# Patient Record
Sex: Male | Born: 1980 | Race: Black or African American | Hispanic: No | Marital: Single | Smoking: Never smoker
Health system: Southern US, Community
[De-identification: ages and names within clinical notes are randomized; demographics above are authoritative.]

---

## 2018-12-05 ENCOUNTER — Ambulatory Visit (INDEPENDENT_AMBULATORY_CARE_PROVIDER_SITE_OTHER): Payer: Managed Care, Other (non HMO)

## 2018-12-05 ENCOUNTER — Other Ambulatory Visit: Payer: Self-pay

## 2018-12-05 ENCOUNTER — Encounter: Payer: Self-pay | Admitting: Emergency Medicine

## 2018-12-05 ENCOUNTER — Ambulatory Visit
Admission: EM | Admit: 2018-12-05 | Discharge: 2018-12-05 | Disposition: A | Discharge status: Home / Self Care | Payer: Managed Care, Other (non HMO) | Attending: Family Medicine | Admitting: Family Medicine

## 2018-12-05 DIAGNOSIS — U071 COVID-19: Secondary | ICD-10-CM

## 2018-12-05 DIAGNOSIS — R0789 Other chest pain: Secondary | ICD-10-CM | POA: Diagnosis present

## 2018-12-05 DIAGNOSIS — R0602 Shortness of breath: Secondary | ICD-10-CM | POA: Diagnosis present

## 2018-12-05 DIAGNOSIS — R05 Cough: Secondary | ICD-10-CM

## 2018-12-05 NOTE — ED Triage Notes (Signed)
Patient states that he tested positive COVID on Tuesday.  Patient states that he saw his PCP earlier this week for just a stuffy nose.  Patient denies fevers prior today.  Patient states that yesterday he started having some SOB and chest tightness.  Patient denies a cough.

## 2018-12-05 NOTE — Discharge Instructions (Signed)
Go to Emergency Department if symptoms worsen °

## 2018-12-07 NOTE — ED Provider Notes (Signed)
[facility]    CSN: [id number] Arrival date & time: 12/05/18  1006      History   Chief Complaint Chief Complaint  Patient presents with  . Shortness of Breath  . Chest Pain    HPI Benjamin Ortega is a 38 y.o. male.   38 yo male with a c/o chest pains and shortness of breath. Patient tested covid-19 positive 3 days ago. Patient denies any cough, fevers, chills, palpitations, wheezing. Otherwise healthy.    Shortness of Breath Associated symptoms: chest pain   Chest Pain Associated symptoms: shortness of breath     History reviewed. No pertinent past medical history.  There are no active problems to display for this patient.   History reviewed. No pertinent surgical history.     Home Medications    Prior to Admission medications   Not on File    Family History Family History  Problem Relation Age of Onset  . Hypertension Mother   . Cancer Mother   . Healthy Father     Social History Social History   Tobacco Use  . Smoking status: Never Smoker  . Smokeless tobacco: Never Used  Substance Use Topics  . Alcohol use: Not Currently  . Drug use: Never     Allergies   Bee venom, Wasp venom, Banana, Oxycodone-acetaminophen, and Prednisone   Review of Systems Review of Systems  Respiratory: Positive for shortness of breath.   Cardiovascular: Positive for chest pain.     Physical Exam Triage Vital Signs ED Triage Vitals  Enc Vitals Group     BP 12/05/18 1022 (!) 144/106     Pulse Rate 12/05/18 1022 85     Resp 12/05/18 1022 16     Temp 12/05/18 1022 98.3 F (36.8 C)     Temp Source 12/05/18 1022 Oral     SpO2 12/05/18 1022 99 %     Weight 12/05/18 1018 280 lb (127 kg)     Height 12/05/18 1018 5\' 4"  (1.626 m)     Head Circumference --      Peak Flow --      Pain Score 12/05/18 1017 7     Pain Loc --      Pain Edu? --      Excl. in [place]? --    No data found.  Updated Vital Signs BP (!) 144/106 (BP Location: Left Arm)   Pulse  85   Temp 98.3 F (36.8 C) (Oral)   Resp 16   Ht 5\' 4"  (1.626 m)   Wt 127 kg   SpO2 99%   BMI 48.06 kg/m   Visual Acuity Right Eye Distance:   Left Eye Distance:   Bilateral Distance:    Right Eye Near:   Left Eye Near:    Bilateral Near:     Physical Exam Vitals signs and nursing note reviewed.  Constitutional:      General: He is not in acute distress.    Appearance: He is not toxic-appearing or diaphoretic.  Cardiovascular:     Rate and Rhythm: Normal rate and regular rhythm.     Pulses: Normal pulses.     Heart sounds: Normal heart sounds.  Pulmonary:     Effort: Pulmonary effort is normal. No respiratory distress.     Breath sounds: Normal breath sounds. No stridor. No wheezing, rhonchi or rales.  Neurological:     Mental Status: He is alert.      UC Treatments / Results  Labs (all  labs ordered are listed, but only abnormal results are displayed) Labs Reviewed - No data to display  EKG   Radiology No results found.  Procedures ED EKG  Date/Time: 12/07/2018 5:15 PM Performed by: Norval Gable, MD Authorized by: Norval Gable, MD   ECG reviewed by ED Physician in the absence of a cardiologist: yes   Interpretation:    Interpretation: normal   Rate:    ECG rate:  83   ECG rate assessment: normal   Rhythm:    Rhythm: sinus rhythm   Ectopy:    Ectopy: none   QRS:    QRS axis:  Normal   QRS intervals:  Normal Conduction:    Conduction: normal   ST segments:    ST segments:  Normal T waves:    T waves: normal     (including critical care time)  Medications Ordered in UC Medications - No data to display  Initial Impression / Assessment and Plan / UC Course  I have reviewed the triage vital signs and the nursing notes.  Pertinent labs & imaging results that were available during my care of the patient were reviewed by me and considered in my medical decision making (see chart for details).      Final Clinical Impressions(s) / UC  Diagnoses   Final diagnoses:  [id number] virus infection  Shortness of breath  Atypical chest pain     Discharge Instructions     Go to Emergency Department if symptoms worsen    ED Prescriptions    None     1. ekg/x-ray results and diagnosis reviewed with patient 2.. Recommend continue supportive treatment 3. Go to ED if symptoms worsen 4. Follow-up prn if symptoms worsen or don't improve  PDMP not reviewed this encounter.   Norval Gable, MD 12/07/18 [phone]

## 2019-06-23 ENCOUNTER — Emergency Department
Admission: EM | Admit: 2019-06-23 | Discharge: 2019-06-23 | Disposition: A | Discharge status: Home / Self Care | Payer: Managed Care, Other (non HMO) | Attending: Emergency Medicine | Admitting: Emergency Medicine

## 2019-06-23 ENCOUNTER — Encounter: Payer: Self-pay | Admitting: *Deleted

## 2019-06-23 ENCOUNTER — Other Ambulatory Visit: Payer: Self-pay

## 2019-06-23 DIAGNOSIS — S99921A Unspecified injury of right foot, initial encounter: Secondary | ICD-10-CM | POA: Diagnosis present

## 2019-06-23 DIAGNOSIS — Y929 Unspecified place or not applicable: Secondary | ICD-10-CM | POA: Diagnosis not present

## 2019-06-23 DIAGNOSIS — Y999 Unspecified external cause status: Secondary | ICD-10-CM | POA: Insufficient documentation

## 2019-06-23 DIAGNOSIS — L089 Local infection of the skin and subcutaneous tissue, unspecified: Secondary | ICD-10-CM | POA: Insufficient documentation

## 2019-06-23 DIAGNOSIS — Y939 Activity, unspecified: Secondary | ICD-10-CM | POA: Diagnosis not present

## 2019-06-23 DIAGNOSIS — R739 Hyperglycemia, unspecified: Secondary | ICD-10-CM | POA: Diagnosis not present

## 2019-06-23 DIAGNOSIS — X58XXXA Exposure to other specified factors, initial encounter: Secondary | ICD-10-CM | POA: Insufficient documentation

## 2019-06-23 DIAGNOSIS — L74513 Primary focal hyperhidrosis, soles: Secondary | ICD-10-CM | POA: Insufficient documentation

## 2019-06-23 DIAGNOSIS — S90424A Blister (nonthermal), right lesser toe(s), initial encounter: Secondary | ICD-10-CM | POA: Diagnosis not present

## 2019-06-23 LAB — CBC WITH DIFFERENTIAL/PLATELET
Abs Immature Granulocytes: 0.02 10*3/uL (ref 0.00–0.07)
Basophils Absolute: 0 10*3/uL (ref 0.0–0.1)
Basophils Relative: 0 %
Eosinophils Absolute: 0.2 10*3/uL (ref 0.0–0.5)
Eosinophils Relative: 3 %
HCT: 42.8 % (ref 39.0–52.0)
Hemoglobin: 13.9 g/dL (ref 13.0–17.0)
Immature Granulocytes: 0 %
Lymphocytes Relative: 32 %
Lymphs Abs: 2.4 10*3/uL (ref 0.7–4.0)
MCH: 28.5 pg (ref 26.0–34.0)
MCHC: 32.5 g/dL (ref 30.0–36.0)
MCV: 87.9 fL (ref 80.0–100.0)
Monocytes Absolute: 0.6 10*3/uL (ref 0.1–1.0)
Monocytes Relative: 9 %
Neutro Abs: 4.1 10*3/uL (ref 1.7–7.7)
Neutrophils Relative %: 56 %
Platelets: 213 10*3/uL (ref 150–400)
RBC: 4.87 MIL/uL (ref 4.22–5.81)
RDW: 11.8 % (ref 11.5–15.5)
WBC: 7.4 10*3/uL (ref 4.0–10.5)
nRBC: 0 % (ref 0.0–0.2)

## 2019-06-23 LAB — BASIC METABOLIC PANEL
Anion gap: 9 (ref 5–15)
BUN: 13 mg/dL (ref 6–20)
CO2: 24 mmol/L (ref 22–32)
Calcium: 9.2 mg/dL (ref 8.9–10.3)
Chloride: 104 mmol/L (ref 98–111)
Creatinine, Ser: 0.82 mg/dL (ref 0.61–1.24)
GFR calc Af Amer: >60 mL/min (ref >60)
GFR calc non Af Amer: >60 mL/min (ref >60)
Glucose, Bld: 211 mg/dL — ABNORMAL HIGH (ref 70–99)
Potassium: 3.6 mmol/L (ref 3.5–5.1)
Sodium: 137 mmol/L (ref 135–145)

## 2019-06-23 MED ORDER — CEPHALEXIN 500 MG PO CAPS: 500.0000 mg | ORAL_CAPSULE | Freq: Three times a day (TID) | ORAL | 0 refills | Status: AC

## 2019-06-23 MED ORDER — CEPHALEXIN 500 MG PO CAPS
500.0000 mg | ORAL_CAPSULE | Freq: Once | ORAL | Status: AC
  Administered 2019-06-23: 23:00:00 500 mg via ORAL
  Filled 2019-06-23: qty 1

## 2019-06-23 NOTE — ED Triage Notes (Signed)
Pt to ED for blister to right foot on Thursday that has not healed. Pt reports the pain has worsened and he started to feel chills and was concerned for an infection. Pt is not diabetic.

## 2019-06-23 NOTE — Discharge Instructions (Addendum)
Keep the blisters intact. Use gauze or Band-aids between the toes to allow protection. Take the prescription antibiotic as directed.

## 2019-06-23 NOTE — ED Notes (Signed)
See triage note- Pt reports blister between first and second toe on right foot. Pt popped blister and said that yellow pus drained out. Pt reports he gets a lot of blisters on his feet.

## 2019-06-23 NOTE — ED Provider Notes (Signed)
[facility] Emergency Department Provider Note ____________________________________________  Time seen: 2158  I have reviewed the triage vital signs and the nursing notes.  HISTORY  Chief Complaint  Blister and Toe Pain  HPI Benjamin Ortega is a 39 y.o. male presents himself to the ED for evaluation of a blister to the second toe on the right.  Patient presents after he admittedly attempted to drain the blister that developed tween his first and second toes.   Patient of the blister on Thursday and began to experience pain in 1 to be began to feel like chills he was concerned for a worsening infection.  Patient is not diabetic, denies any wet work but does note excessive sweating to his feet.  He denies any interim fevers he denies any purulent drainage from the toe.  He also denies any swelling, streaking, or increased pain to the right foot.  History reviewed. No pertinent past medical history.  There are no problems to display for this patient.   History reviewed. No pertinent surgical history.  Prior to Admission medications   Medication Sig Start Date End Date Taking? Authorizing Provider  cephALEXin (KEFLEX) 500 MG capsule Take 1 capsule (500 mg total) by mouth 3 (three) times daily for 7 days. 06/23/19 06/30/19  Vito Beg, Dannielle Karvonen, PA-C    Allergies Bee venom, Wasp venom, Banana, Oxycodone-acetaminophen, and Prednisone  Family History  Problem Relation Age of Onset  . Hypertension Mother   . Cancer Mother   . Healthy Father     Social History Social History   Tobacco Use  . Smoking status: Never Smoker  . Smokeless tobacco: Never Used  Substance Use Topics  . Alcohol use: Not Currently  . Drug use: Never    Review of Systems  Constitutional: Negative for fever. Cardiovascular: Negative for chest pain. Respiratory: Negative for shortness of breath. Musculoskeletal: Negative for back pain. Skin: Negative for rash.  Right foot blister as  above. Neurological: Negative for headaches, focal weakness or numbness. ____________________________________________  PHYSICAL EXAM:  VITAL SIGNS: ED Triage Vitals  Enc Vitals Group     BP 06/23/19 2141 (!) 163/99     Pulse Rate 06/23/19 2141 71     Resp 06/23/19 2141 16     Temp 06/23/19 2141 98.7 F (37.1 C)     Temp Source 06/23/19 2141 Oral     SpO2 06/23/19 2141 99 %     Weight 06/23/19 2142 292 lb (132.5 kg)     Height 06/23/19 2142 6\' 4"  (1.93 m)     Head Circumference --      Peak Flow --      Pain Score 06/23/19 2142 0     Pain Loc --      Pain Edu? --      Excl. in [place]? --     Constitutional: Alert and oriented. Well appearing and in no distress. Head: Normocephalic and atraumatic. Eyes: Conjunctivae are normal. PERRL. Normal extraocular movements Cardiovascular: Normal rate, regular rhythm. Normal distal pulses. Respiratory: Normal respiratory effort. No wheezes/rales/rhonchi. Gastrointestinal: Soft and nontender. No distention. Musculoskeletal: Nontender with normal range of motion in all extremities.  Neurologic:  Normal gait without ataxia. Normal speech and language. No gross focal neurologic deficits are appreciated. Skin:  Skin is warm, dry and intact. No rash noted.  Patient's right foot without any obvious swelling, ecchymosis, bruise, lymphangitis, or streaking.  Patient with a flat intact blister to the intertriginous portion of the second toe laterally.  There is some mild purulence noted.  There is also a single needlestick consistent with patient's previous attempt to drain the blister.  No dehiscence, purulence, fluctuance is noted. Psychiatric: Mood and affect are normal. Patient exhibits appropriate insight and judgment. ____________________________________________   LABS (pertinent positives/negatives) Labs Reviewed  BASIC METABOLIC PANEL - Abnormal; Notable for the following components:      Result Value   Glucose, Bld 211 (*)    All other  components within normal limits  CBC WITH DIFFERENTIAL/PLATELET  ___________________________________________  PROCEDURES  Keflex 500 mg p.o.  Procedures ____________________________________________  INITIAL IMPRESSION / ASSESSMENT AND PLAN / ED COURSE  She with ED evaluation and concern over possible infected blister to the second toe on the right.  Patient developed a blister several days earlier, presents today after he attempted a needle decompression.  He will be treated empirically with Keflex and his labs are reassuring as it shows no signs of an acute infectious process.  Patient was notified of his elevated blood sugar and will continue to monitor.  Return precautions have been reviewed.  He is to follow-up with podiatry for ongoing symptoms.  Benjamin Ortega was evaluated in Emergency Department on 06/23/2019 for the symptoms described in the history of present illness. He was evaluated in the context of the global COVID-19 pandemic, which necessitated consideration that the patient might be at risk for infection with the SARS-CoV-2 virus that causes COVID-19. Institutional protocols and algorithms that pertain to the evaluation of patients at risk for COVID-19 are in a state of rapid change based on information released by regulatory bodies including the CDC and federal and state organizations. These policies and algorithms were followed during the patient's care in the ED. ____________________________________________  FINAL CLINICAL IMPRESSION(S) / ED DIAGNOSES  Final diagnoses:  Blister of second toe, right, infected, initial encounter      Lissa Hoard, PA-C 06/23/19 2305    Dionne Bucy, MD 06/23/19 2327

## 2019-07-29 ENCOUNTER — Encounter: Payer: Self-pay | Admitting: Emergency Medicine

## 2019-07-29 ENCOUNTER — Emergency Department
Admission: EM | Admit: 2019-07-29 | Discharge: 2019-07-29 | Disposition: A | Discharge status: Home / Self Care | Payer: Managed Care, Other (non HMO) | Attending: Student | Admitting: Student

## 2019-07-29 DIAGNOSIS — R232 Flushing: Secondary | ICD-10-CM | POA: Diagnosis present

## 2019-07-29 DIAGNOSIS — T781XXA Other adverse food reactions, not elsewhere classified, initial encounter: Secondary | ICD-10-CM | POA: Diagnosis not present

## 2019-07-29 DIAGNOSIS — Z91030 Bee allergy status: Secondary | ICD-10-CM | POA: Diagnosis not present

## 2019-07-29 MED ORDER — DEXAMETHASONE 6 MG PO TABS: 6.0000 mg | ORAL_TABLET | Freq: Every day | ORAL | 0 refills | Status: AC

## 2019-07-29 MED ORDER — DEXAMETHASONE 4 MG PO TABS
6.0000 mg | ORAL_TABLET | Freq: Once | ORAL | Status: AC
  Administered 2019-07-29: 6 mg via ORAL
  Filled 2019-07-29: qty 1.5

## 2019-07-29 NOTE — ED Notes (Signed)
Called pharmacy to request medication 

## 2019-07-29 NOTE — ED Notes (Signed)
Per ems pt thinks he is having an allergic reaction to shrimp. Feels the same as it did with bees. Pt has taken 50mg  of benadryl per fire dept.

## 2019-07-29 NOTE — ED Provider Notes (Signed)
[facility] Emergency Department Provider Note  ____________________________________________  Time seen: Approximately 10:31 PM  I have reviewed the triage vital signs and the nursing notes.   HISTORY  Chief Complaint Allergic Reaction    HPI Benjamin Ortega is a 39 y.o. male who presents the emergency department for evaluation of possible allergic reaction.  Patient states that  he was eating shrimp tonight, started to feel a flush sensation.  Patient states that he has eaten shrimp in the past with no difficulties.  He was using a new spice on the shrimp and typical.  He states that the shrimp seasoning was very spicy.  Patient took Benadryl, states that the flush sensation went away.  He has had no rash, oropharyngeal edema.  No shortness of breath or wheezing.  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  Given his history of reaction he presented to the emergency department for evaluation.  Currently asymptomatic.        History reviewed. No pertinent past medical history.  There are no problems to display for this patient.   History reviewed. No pertinent surgical history.  Prior to Admission medications   Medication Sig Start Date End Date Taking? Authorizing Provider  dexamethasone (DECADRON) 6 MG tablet Take 1 tablet (6 mg total) by mouth daily for 5 days. 07/29/19 08/03/19  Rika Daughdrill, Charline Bills, PA-C    Allergies Bee venom, Wasp venom, Banana, Oxycodone-acetaminophen, and Prednisone  Family History  Problem Relation Age of Onset  . Hypertension Mother   . Cancer Mother   . Healthy Father     Social History Social History   Tobacco Use  . Smoking status: Never Smoker  . Smokeless tobacco: Never Used  Substance Use Topics  . Alcohol use: Not Currently  . Drug use: Never     Review of Systems  Constitutional: No fever/chills.  Positive for flush sensation after eating shrimp with hot seasoning Eyes: No  visual changes. No discharge ENT: No upper respiratory complaints. Cardiovascular: no chest pain. Respiratory: no cough. No SOB. Gastrointestinal: No abdominal pain.  No nausea, no vomiting.  No diarrhea.  No constipation. Musculoskeletal: Negative for musculoskeletal pain. Skin: Negative for rash, abrasions, lacerations, ecchymosis. Neurological: Negative for headaches, focal weakness or numbness. 10-point ROS otherwise negative.  ____________________________________________   PHYSICAL EXAM:  VITAL SIGNS: ED Triage Vitals  Enc Vitals Group     BP 07/29/19 2102 133/89     Pulse Rate 07/29/19 2102 73     Resp 07/29/19 2102 16     Temp 07/29/19 2102 98.3 F (36.8 C)     Temp Source 07/29/19 2102 Oral     SpO2 07/29/19 2102 96 %     Weight --      Height --      Head Circumference --      Peak Flow --      Pain Score 07/29/19 2226 0     Pain Loc --      Pain Edu? --      Excl. in [place]? --      Constitutional: Alert and oriented. Well appearing and in no acute distress. Eyes: Conjunctivae are normal. PERRL. EOMI. Head: Atraumatic. ENT:      Ears:       Nose: No congestion/rhinnorhea.      Mouth/Throat: Mucous membranes are moist.  No oropharyngeal erythema or edema.  Uvula is midline. Neck: No stridor.    Cardiovascular: Normal rate, regular rhythm.  Normal S1 and S2.  Good peripheral circulation. Respiratory: Normal respiratory effort without tachypnea or retractions. Lungs CTAB. Good air entry to the bases with no decreased or absent breath sounds. Gastrointestinal: Bowel sounds 4 quadrants. Soft and nontender to palpation. No guarding or rigidity. No palpable masses. No distention. No CVA tenderness. Musculoskeletal: Full range of motion to all extremities. No gross deformities appreciated. Neurologic:  Normal speech and language. No gross focal neurologic deficits are appreciated.  Skin:  Skin is warm, dry and intact. No rash noted. Psychiatric: Mood and affect are  normal. Speech and behavior are normal. Patient exhibits appropriate insight and judgement.   ____________________________________________   LABS (all labs ordered are listed, but only abnormal results are displayed)  Labs Reviewed - No data to display ____________________________________________  EKG   ____________________________________________  RADIOLOGY   No results found.  ____________________________________________    PROCEDURES  Procedure(s) performed:    Procedures    Medications  dexamethasone (DECADRON) tablet 6 mg (has no administration in time range)     ____________________________________________   INITIAL IMPRESSION / ASSESSMENT AND PLAN / ED COURSE  Pertinent labs & imaging results that were available during my care of the patient were reviewed by me and considered in my medical decision making (see chart for details).  Review of the  [place]RS was performed in accordance of the [facility] prior to dispensing any controlled drugs.           Patient's diagnosis is consistent with adverse reaction to food.  Patient presented to the emergency department with a complaint of flushed sensation after eating shrimp with a new seasoning.  Patient took Benadryl out of precaution and states that he is currently asymptomatic.  No rash, angioedema, respiratory or GI symptoms.  At this time I will treat the patient with steroid in the case that this may be an ingested food allergic reaction.  However I feel that this is likely just an adverse reaction to the hot spices.  Patient is allergic to prednisone with itching but I will place the patient on dexamethasone.  He may use antihistamines at home if needed.  Follow-up primary care as needed..  Patient is given ED precautions to return to the ED for any worsening or new symptoms.     ____________________________________________  FINAL CLINICAL IMPRESSION(S) / ED DIAGNOSES  Final diagnoses:  Adverse food  reaction, initial encounter      NEW MEDICATIONS STARTED DURING THIS VISIT:  ED Discharge Orders         Ordered    dexamethasone (DECADRON) 6 MG tablet  Daily     07/29/19 2231              This chart was dictated using voice recognition software/Dragon. Despite best efforts to proofread, errors can occur which can change the meaning. Any change was purely unintentional.    Racheal Patches, PA-C 07/29/19 2238    Miguel Aschoff., MD 07/30/19 [phone]

## 2019-07-29 NOTE — ED Notes (Signed)
See triage note- pt with swelling present on eyes. No respiratory difficulty, pt denies SHOB. Pt here with allergic reaction to shrimp yesterday.

## 2019-07-29 NOTE — ED Triage Notes (Signed)
Pt arrived via EMS from home where he reported eating shrimp approx 21 hour ago and started to feel "hot." pt received 50mg  Benadryl by fire depart. No swelling, airway clear. Pt c/o drowsiness now. Pt ambulatory to triage with steady gait.

## 2020-08-06 IMAGING — CR DG CHEST 2V
2 series · 2 of 2 positions shown · non-contrast
Comparison: None.

CLINICAL DATA: Shortness of breath, [id number] positive

EXAM:
CHEST - 2 VIEW

[chest pa]
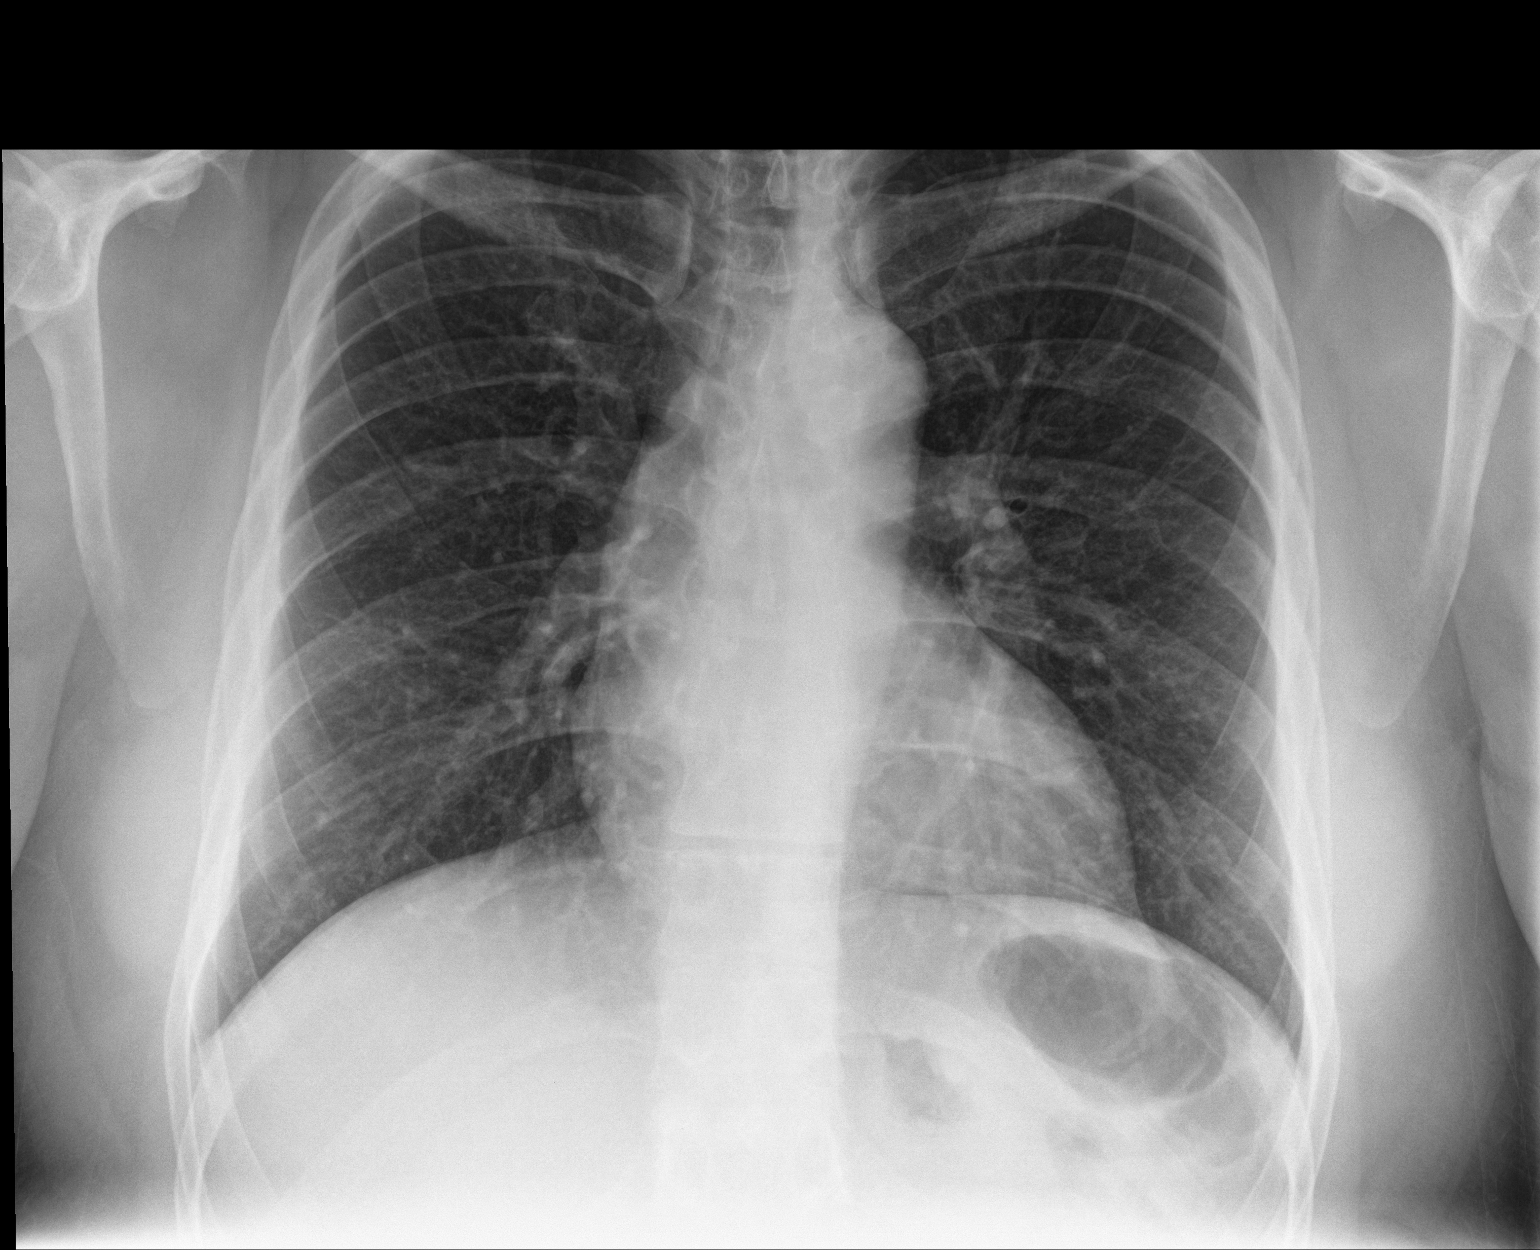

[chest lat]
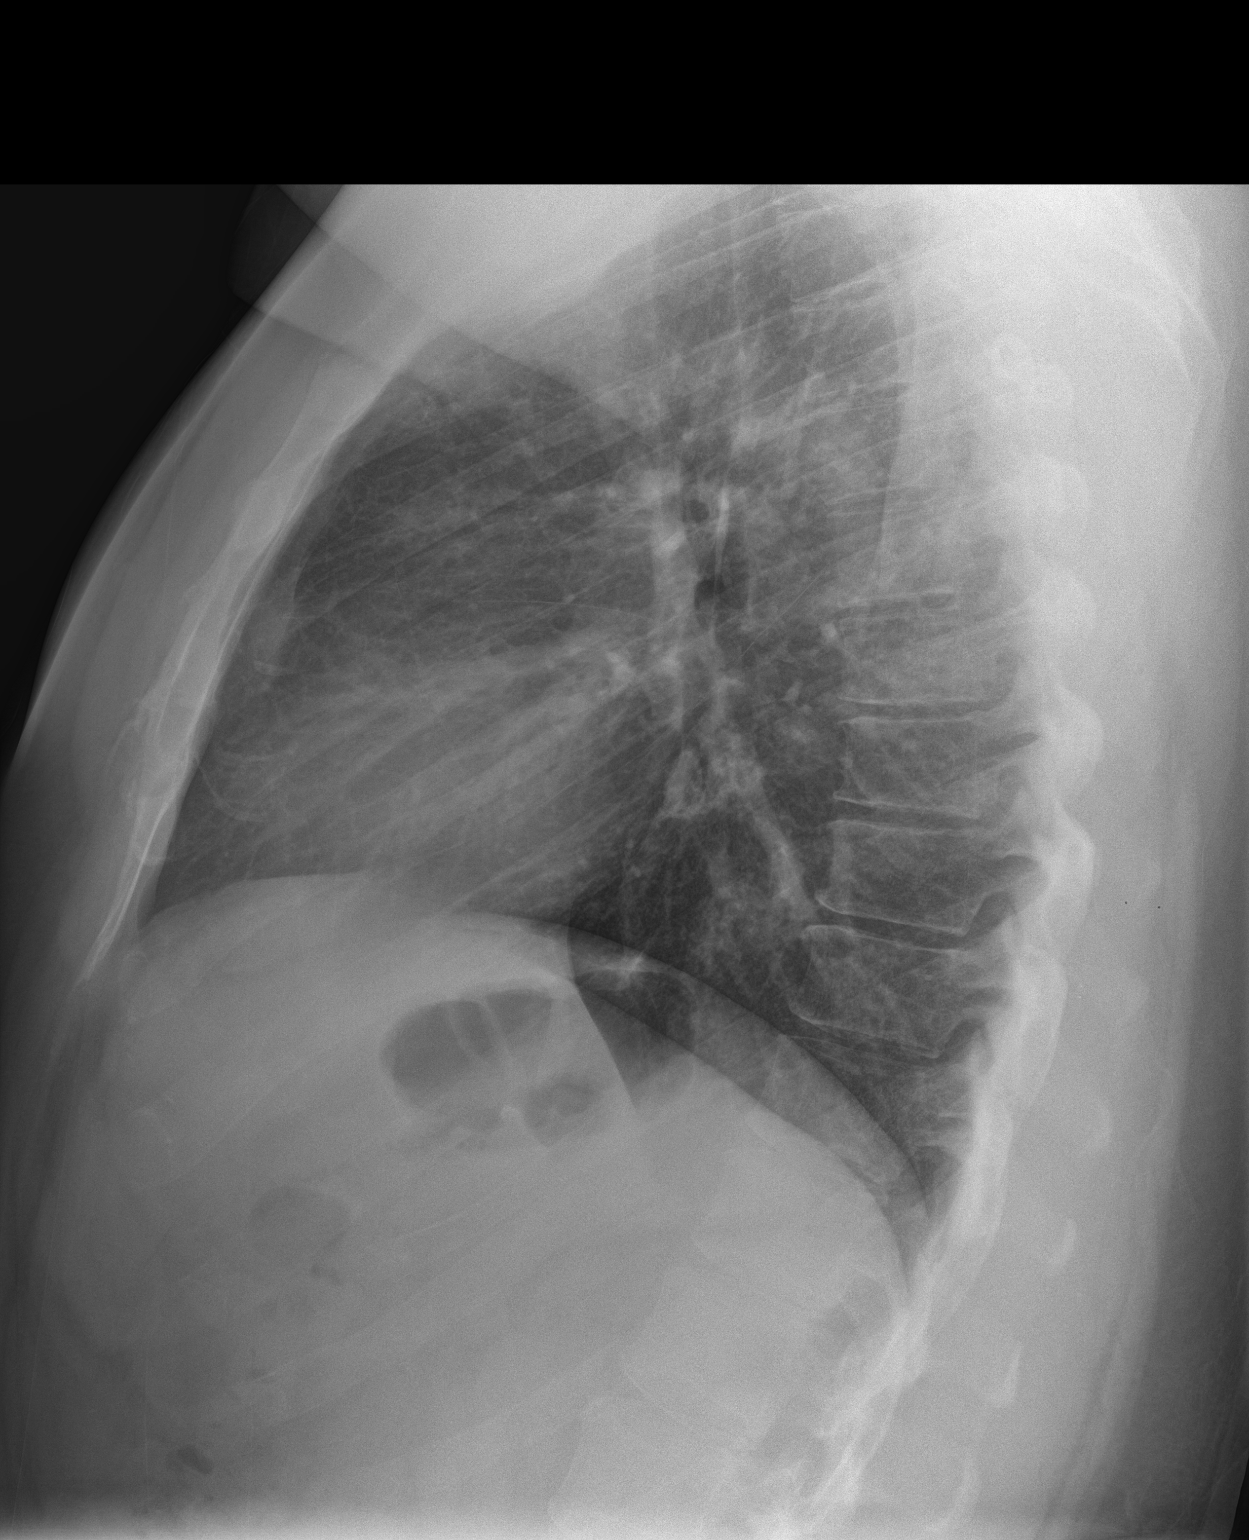

[2 of 2 positions shown; findings below may reference images not displayed]

FINDINGS: The heart size and mediastinal contours are within normal limits.
Both lungs are clear. The visualized skeletal structures are
unremarkable.
IMPRESSION: No acute abnormality of the lungs.

## 2020-11-06 ENCOUNTER — Ambulatory Visit (INDEPENDENT_AMBULATORY_CARE_PROVIDER_SITE_OTHER): Payer: Managed Care, Other (non HMO)

## 2020-11-06 ENCOUNTER — Other Ambulatory Visit: Payer: Self-pay

## 2020-11-06 ENCOUNTER — Ambulatory Visit
Admission: EM | Admit: 2020-11-06 | Discharge: 2020-11-06 | Disposition: A | Discharge status: Home / Self Care | Payer: Managed Care, Other (non HMO) | Attending: Family Medicine | Admitting: Family Medicine

## 2020-11-06 DIAGNOSIS — M549 Dorsalgia, unspecified: Secondary | ICD-10-CM

## 2020-11-06 DIAGNOSIS — M545 Low back pain, unspecified: Secondary | ICD-10-CM | POA: Diagnosis not present

## 2020-11-06 LAB — POCT URINALYSIS DIP (DEVICE)
Bilirubin Urine: NEGATIVE
Glucose, UA: NEGATIVE mg/dL
Ketones, ur: NEGATIVE mg/dL
Leukocytes,Ua: NEGATIVE
Nitrite: NEGATIVE
Protein, ur: 30 mg/dL — AB
Specific Gravity, Urine: >=1.03 (ref 1.005–1.030)
Urobilinogen, UA: 2 mg/dL — ABNORMAL HIGH (ref 0.0–1.0)
pH: 6 (ref 5.0–8.0)

## 2020-11-06 MED ORDER — MELOXICAM 15 MG PO TABS: 15.0000 mg | ORAL_TABLET | Freq: Every day | ORAL | 0 refills | Status: AC | PRN

## 2020-11-06 MED ORDER — TIZANIDINE HCL 4 MG PO TABS: 4.0000 mg | ORAL_TABLET | Freq: Three times a day (TID) | ORAL | 0 refills | Status: AC | PRN

## 2020-11-06 NOTE — Discharge Instructions (Addendum)
No evidence of stone.  Medication as prescribed.  Take care  Dr. Adriana Simas

## 2020-11-06 NOTE — ED Triage Notes (Signed)
Patient states that he started having left sided groin pain yesterday with left low back pain. Denies any injury, problems urinating or recent heavy lifting.

## 2020-11-06 NOTE — ED Provider Notes (Signed)
[facility]    CSN: [id number] Arrival date & time: 11/06/20  1032      History   Chief Complaint Chief Complaint  Patient presents with   Groin Pain    left    HPI 40 year old male presents with the above complaint.  Patient reports left-sided groin pain and left low back pain which started abruptly yesterday.  Patient states that his urine has been "cloudy".  He denies any other urinary symptoms.  No fall, trauma, injury.  No change in activity.  His pain is 4/10 in severity.  Described as achy.  No relieving factors.   Home Medications    Prior to Admission medications   Medication Sig Start Date End Date Taking? Authorizing Provider  meloxicam (MOBIC) 15 MG tablet Take 1 tablet (15 mg total) by mouth daily as needed for pain. 11/06/20  Yes Johne Buckle G, DO  omeprazole (PRILOSEC) 40 MG capsule Take by mouth.   Yes [provider]  tiZANidine (ZANAFLEX) 4 MG tablet Take 1 tablet (4 mg total) by mouth every 8 (eight) hours as needed for muscle spasms. 11/06/20  Yes Tommie Sams, DO    Family History Family History  Problem Relation Age of Onset   Hypertension Mother    Cancer Mother    Healthy Father     Social History Social History   Tobacco Use   Smoking status: Never   Smokeless tobacco: Never  Vaping Use   Vaping Use: Never used  Substance Use Topics   Alcohol use: Not Currently   Drug use: Never     Allergies   Bee venom, Wasp venom, Banana, Oxycodone-acetaminophen, and Prednisone   Review of Systems Review of Systems Per HPI  Physical Exam Triage Vital Signs ED Triage Vitals  Enc Vitals Group     BP 11/06/20 1050 (!) 123/95     Pulse Rate 11/06/20 1050 62     Resp 11/06/20 1050 18     Temp 11/06/20 1050 98.6 F (37 C)     Temp Source 11/06/20 1050 Oral     SpO2 11/06/20 1050 100 %     Weight 11/06/20 1049 280 lb (127 kg)     Height 11/06/20 1049 6\' 4"  (1.93 m)     Head Circumference --      Peak Flow --       Pain Score 11/06/20 1049 4     Pain Loc --      Pain Edu? --      Excl. in [place]? --    No data found.  Updated Vital Signs BP (!) 123/95 (BP Location: Left Arm)   Pulse 62   Temp 98.6 F (37 C) (Oral)   Resp 18   Ht 6\' 4"  (1.93 m)   Wt 127 kg   SpO2 100%   BMI 34.08 kg/m   Visual Acuity Right Eye Distance:   Left Eye Distance:   Bilateral Distance:    Right Eye Near:   Left Eye Near:    Bilateral Near:     Physical Exam Constitutional:      General: He is not in acute distress.    Appearance: Normal appearance. He is not ill-appearing.  HENT:     Head: Normocephalic and atraumatic.  Cardiovascular:     Rate and Rhythm: Normal rate and regular rhythm.  Pulmonary:     Effort: Pulmonary effort is normal.     Breath sounds: Normal breath sounds. No wheezing or rales.  Abdominal:     General: There is no distension.     Palpations: Abdomen is soft.     Comments: Mild tenderness of the left groin  Musculoskeletal:     Comments: Mild tenderness of the left paraspinal musculature of the lumbar spine.  Neurological:     Mental Status: He is alert.   UC Treatments / Results  Labs (all labs ordered are listed, but only abnormal results are displayed) Labs Reviewed  POCT URINALYSIS DIP (DEVICE) - Abnormal; Notable for the following components:      Result Value   Hgb urine dipstick TRACE (*)    Protein, ur 30 (*)    Urobilinogen, UA 2.0 (*)    All other components within normal limits  POCT URINALYSIS DIPSTICK, ED / UC    EKG   Radiology DG Abd 1 View  Result Date: 11/06/2020 CLINICAL DATA:  Groin and lower back pain. EXAM: ABDOMEN - 1 VIEW COMPARISON:  None. FINDINGS: The bowel gas pattern is normal. No radio-opaque calculi or other significant radiographic abnormality are seen. IMPRESSION: Negative. Electronically Signed   By: Lupita Raider M.D.   On: 11/06/2020 11:46    Procedures Procedures (including critical care time)  Medications Ordered in  UC Medications - No data to display  Initial Impression / Assessment and Plan / UC Course  I have reviewed the triage vital signs and the nursing notes.  Pertinent labs & imaging results that were available during my care of the patient were reviewed by me and considered in my medical decision making (see chart for details).    40 year old male presents with low back pain and groin pain.  Tenderness on exam.  Trace blood on urinalysis.  KUB was obtained and was independently reviewed by me.  KUB was normal.  There was no evidence of stone.  Treating for musculoskeletal etiology with meloxicam and Zanaflex.  Supportive care.  Final Clinical Impressions(s) / UC Diagnoses   Final diagnoses:  Acute left-sided low back pain without sciatica     Discharge Instructions      No evidence of stone.  Medication as prescribed.  Take care  Dr. Adriana Simas    ED Prescriptions     Medication Sig Dispense Auth. Provider   meloxicam (MOBIC) 15 MG tablet Take 1 tablet (15 mg total) by mouth daily as needed for pain. 30 tablet Akiva Brassfield G, DO   tiZANidine (ZANAFLEX) 4 MG tablet Take 1 tablet (4 mg total) by mouth every 8 (eight) hours as needed for muscle spasms. 30 tablet Tommie Sams, DO      PDMP not reviewed this encounter.   Tommie Sams, [place] 11/06/20 1219

## 2020-12-03 ENCOUNTER — Other Ambulatory Visit: Payer: Self-pay | Admitting: Family Medicine
